# Patient Record
Sex: Male | Born: 1985 | Race: White | Hispanic: No | Marital: Single | Smoking: Current every day smoker
Health system: Southern US, Community
[De-identification: ages and names within clinical notes are randomized; demographics above are authoritative.]

## PROBLEM LIST (undated history)

## (undated) DIAGNOSIS — N451 Epididymitis: Secondary | ICD-10-CM

---
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
## 2006-08-01 ENCOUNTER — Emergency Department: Payer: Self-pay | Admitting: Unknown Physician Specialty

## 2007-01-17 ENCOUNTER — Emergency Department: Payer: Self-pay | Admitting: Emergency Medicine

## 2008-08-27 ENCOUNTER — Emergency Department: Payer: Self-pay | Admitting: Emergency Medicine

## 2008-09-30 ENCOUNTER — Ambulatory Visit: Payer: Self-pay | Admitting: Specialist

## 2008-12-27 ENCOUNTER — Emergency Department: Payer: Self-pay | Admitting: Emergency Medicine

## 2009-01-20 ENCOUNTER — Emergency Department: Payer: Self-pay | Admitting: Emergency Medicine

## 2009-03-01 ENCOUNTER — Emergency Department: Payer: Self-pay | Admitting: Emergency Medicine

## 2009-05-06 ENCOUNTER — Emergency Department: Payer: Self-pay | Admitting: Emergency Medicine

## 2009-10-31 ENCOUNTER — Emergency Department: Payer: Self-pay | Admitting: Emergency Medicine

## 2010-01-04 ENCOUNTER — Emergency Department: Payer: Self-pay | Admitting: Unknown Physician Specialty

## 2010-09-08 ENCOUNTER — Emergency Department (HOSPITAL_COMMUNITY): Admission: EM | Admit: 2010-09-08 | Discharge: 2010-01-19 | Payer: Self-pay | Admitting: Emergency Medicine

## 2010-11-17 ENCOUNTER — Emergency Department: Payer: Self-pay | Admitting: Emergency Medicine

## 2010-12-20 LAB — URINALYSIS, ROUTINE W REFLEX MICROSCOPIC
Glucose, UA: NEGATIVE mg/dL
Hgb urine dipstick: NEGATIVE
Nitrite: NEGATIVE
Specific Gravity, Urine: 1.029 (ref 1.005–1.030)
pH: 6 (ref 5.0–8.0)

## 2011-01-25 ENCOUNTER — Emergency Department: Payer: Self-pay | Admitting: Emergency Medicine

## 2011-04-21 ENCOUNTER — Emergency Department: Payer: Self-pay | Admitting: Emergency Medicine

## 2011-07-19 ENCOUNTER — Emergency Department (HOSPITAL_COMMUNITY)
Admission: EM | Admit: 2011-07-19 | Discharge: 2011-07-19 | Disposition: A | Discharge status: Home / Self Care | Payer: Self-pay | Attending: Emergency Medicine | Admitting: Emergency Medicine

## 2011-07-19 ENCOUNTER — Encounter (HOSPITAL_COMMUNITY): Payer: Self-pay | Admitting: Radiology

## 2011-07-19 ENCOUNTER — Emergency Department (HOSPITAL_COMMUNITY): Payer: Self-pay

## 2011-07-19 DIAGNOSIS — N509 Disorder of male genital organs, unspecified: Secondary | ICD-10-CM | POA: Insufficient documentation

## 2011-07-19 DIAGNOSIS — N453 Epididymo-orchitis: Secondary | ICD-10-CM | POA: Insufficient documentation

## 2011-07-19 LAB — BASIC METABOLIC PANEL
BUN: 12 mg/dL (ref 6–23)
Chloride: 98 mEq/L (ref 96–112)
Glucose, Bld: 92 mg/dL (ref 70–99)

## 2011-07-19 LAB — URINALYSIS, ROUTINE W REFLEX MICROSCOPIC
Bilirubin Urine: NEGATIVE
Glucose, UA: NEGATIVE mg/dL
Ketones, ur: NEGATIVE mg/dL
Protein, ur: NEGATIVE mg/dL
Urobilinogen, UA: 0.2 mg/dL (ref 0.0–1.0)

## 2011-07-19 LAB — CBC
Hemoglobin: 13.7 g/dL (ref 13.0–17.0)
MCH: 29.4 pg (ref 26.0–34.0)
MCHC: 34.1 g/dL (ref 30.0–36.0)
Platelets: 261 10*3/uL (ref 150–400)
RDW: 13.5 % (ref 11.5–15.5)

## 2011-07-19 LAB — DIFFERENTIAL
Eosinophils Absolute: 0.2 10*3/uL (ref 0.0–0.7)
Lymphs Abs: 3.2 10*3/uL (ref 0.7–4.0)
Monocytes Relative: 10 % (ref 3–12)

## 2011-07-19 LAB — URINE MICROSCOPIC-ADD ON

## 2011-07-21 ENCOUNTER — Emergency Department: Payer: Self-pay | Admitting: Emergency Medicine

## 2011-07-23 ENCOUNTER — Emergency Department: Payer: Self-pay | Admitting: *Deleted

## 2012-01-07 ENCOUNTER — Emergency Department: Payer: Self-pay | Admitting: Emergency Medicine

## 2012-01-07 LAB — URINALYSIS, COMPLETE
Bilirubin,UR: NEGATIVE
Hyaline Cast: 1
Ketone: NEGATIVE
Leukocyte Esterase: NEGATIVE
Ph: 5 (ref 4.5–8.0)
Specific Gravity: 1.024 (ref 1.003–1.030)

## 2012-01-08 LAB — URINE CULTURE

## 2012-05-09 ENCOUNTER — Emergency Department: Payer: Self-pay | Admitting: *Deleted

## 2012-05-09 LAB — URINALYSIS, COMPLETE
Glucose,UR: NEGATIVE mg/dL (ref 0–75)
Leukocyte Esterase: NEGATIVE
Nitrite: NEGATIVE
Protein: NEGATIVE
RBC,UR: 1 /HPF (ref 0–5)
Specific Gravity: 1.016 (ref 1.003–1.030)
WBC UR: 2 /HPF (ref 0–5)

## 2012-05-09 LAB — DRUG SCREEN, URINE
Barbiturates, Ur Screen: NEGATIVE (ref ?–200)
Cannabinoid 50 Ng, Ur ~~LOC~~: POSITIVE (ref ?–50)
Cocaine Metabolite,Ur ~~LOC~~: NEGATIVE (ref ?–300)
Methadone, Ur Screen: NEGATIVE (ref ?–300)
Opiate, Ur Screen: POSITIVE (ref ?–300)
Phencyclidine (PCP) Ur S: NEGATIVE (ref ?–25)

## 2013-04-22 ENCOUNTER — Emergency Department: Payer: Self-pay | Admitting: Emergency Medicine

## 2013-05-04 ENCOUNTER — Emergency Department: Payer: Self-pay | Admitting: Emergency Medicine

## 2013-05-04 LAB — CBC WITH DIFFERENTIAL/PLATELET
Basophil #: 0.1 10*3/uL (ref 0.0–0.1)
Eosinophil #: 0.1 10*3/uL (ref 0.0–0.7)
Lymphocyte %: 33.1 %
MCHC: 34.4 g/dL (ref 32.0–36.0)
Monocyte #: 0.8 x10 3/mm (ref 0.2–1.0)
Neutrophil %: 55.2 %
RDW: 14.6 % — ABNORMAL HIGH (ref 11.5–14.5)
WBC: 9.1 10*3/uL (ref 3.8–10.6)

## 2013-05-04 LAB — BASIC METABOLIC PANEL
BUN: 7 mg/dL (ref 7–18)
Creatinine: 0.85 mg/dL (ref 0.60–1.30)
EGFR (African American): >60
Glucose: 95 mg/dL (ref 65–99)
Osmolality: 277 (ref 275–301)
Potassium: 3.7 mmol/L (ref 3.5–5.1)

## 2013-05-09 LAB — CULTURE, BLOOD (SINGLE)

## 2013-05-13 ENCOUNTER — Emergency Department: Payer: Self-pay | Admitting: Emergency Medicine

## 2013-05-13 LAB — COMPREHENSIVE METABOLIC PANEL
Albumin: 3.2 g/dL — ABNORMAL LOW (ref 3.4–5.0)
Alkaline Phosphatase: 91 U/L (ref 50–136)
Anion Gap: 5 — ABNORMAL LOW (ref 7–16)
BUN: 7 mg/dL (ref 7–18)
Bilirubin,Total: 0.4 mg/dL (ref 0.2–1.0)
Calcium, Total: 8.5 mg/dL (ref 8.5–10.1)
Chloride: 105 mmol/L (ref 98–107)
Co2: 29 mmol/L (ref 21–32)
Creatinine: 0.91 mg/dL (ref 0.60–1.30)
EGFR (African American): >60
EGFR (Non-African Amer.): >60
Glucose: 91 mg/dL (ref 65–99)
Osmolality: 275 (ref 275–301)
Potassium: 4.2 mmol/L (ref 3.5–5.1)
SGOT(AST): 212 U/L — ABNORMAL HIGH (ref 15–37)
SGPT (ALT): 484 U/L — ABNORMAL HIGH (ref 12–78)
Sodium: 139 mmol/L (ref 136–145)
Total Protein: 7.3 g/dL (ref 6.4–8.2)

## 2013-05-13 LAB — CBC
HCT: 43.5 % (ref 40.0–52.0)
HGB: 14.7 g/dL (ref 13.0–18.0)
MCH: 28.7 pg (ref 26.0–34.0)
MCHC: 33.9 g/dL (ref 32.0–36.0)
MCV: 85 fL (ref 80–100)
Platelet: 246 10*3/uL (ref 150–440)
RBC: 5.13 10*6/uL (ref 4.40–5.90)
RDW: 14.6 % — ABNORMAL HIGH (ref 11.5–14.5)
WBC: 7.1 10*3/uL (ref 3.8–10.6)

## 2013-05-13 LAB — LIPASE, BLOOD: Lipase: 86 U/L (ref 73–393)

## 2013-05-29 ENCOUNTER — Emergency Department: Payer: Self-pay | Admitting: Emergency Medicine

## 2013-05-29 LAB — URINALYSIS, COMPLETE
Bacteria: NONE SEEN
Blood: NEGATIVE
Nitrite: NEGATIVE
Ph: 5 (ref 4.5–8.0)
RBC,UR: <1 /HPF (ref 0–5)
Specific Gravity: 1.031 (ref 1.003–1.030)
WBC UR: 1 /HPF (ref 0–5)

## 2013-06-24 ENCOUNTER — Encounter (HOSPITAL_COMMUNITY): Payer: Self-pay | Admitting: *Deleted

## 2013-06-24 ENCOUNTER — Emergency Department (HOSPITAL_COMMUNITY): Payer: Self-pay

## 2013-06-24 ENCOUNTER — Emergency Department (HOSPITAL_COMMUNITY)
Admission: EM | Admit: 2013-06-24 | Discharge: 2013-06-24 | Disposition: A | Discharge status: Home / Self Care | Payer: Self-pay | Attending: Emergency Medicine | Admitting: Emergency Medicine

## 2013-06-24 DIAGNOSIS — R109 Unspecified abdominal pain: Secondary | ICD-10-CM | POA: Insufficient documentation

## 2013-06-24 DIAGNOSIS — N50819 Testicular pain, unspecified: Secondary | ICD-10-CM

## 2013-06-24 DIAGNOSIS — F17200 Nicotine dependence, unspecified, uncomplicated: Secondary | ICD-10-CM | POA: Insufficient documentation

## 2013-06-24 DIAGNOSIS — Z87442 Personal history of urinary calculi: Secondary | ICD-10-CM | POA: Insufficient documentation

## 2013-06-24 DIAGNOSIS — N509 Disorder of male genital organs, unspecified: Secondary | ICD-10-CM | POA: Insufficient documentation

## 2013-06-24 HISTORY — DX: Epididymitis: N45.1

## 2013-06-24 LAB — CBC
HCT: 42.9 % (ref 39.0–52.0)
MCHC: 35.4 g/dL (ref 30.0–36.0)
MCV: 85 fL (ref 78.0–100.0)
Platelets: 262 10*3/uL (ref 150–400)
RBC: 5.05 MIL/uL (ref 4.22–5.81)
RDW: 14.6 % (ref 11.5–15.5)
WBC: 9.6 10*3/uL (ref 4.0–10.5)

## 2013-06-24 LAB — URINALYSIS, ROUTINE W REFLEX MICROSCOPIC
Glucose, UA: NEGATIVE mg/dL
Ketones, ur: NEGATIVE mg/dL
Leukocytes, UA: NEGATIVE

## 2013-06-24 LAB — POCT I-STAT, CHEM 8
Chloride: 99 mEq/L (ref 96–112)
Creatinine, Ser: 1.2 mg/dL (ref 0.50–1.35)
Hemoglobin: 16 g/dL (ref 13.0–17.0)
Potassium: 4 mEq/L (ref 3.5–5.1)
Sodium: 141 mEq/L (ref 135–145)

## 2013-06-24 MED ORDER — TRAMADOL HCL 50 MG PO TABS: 50.0000 mg | ORAL_TABLET | Freq: Four times a day (QID) | ORAL | Status: AC | PRN

## 2013-06-24 MED ORDER — OXYCODONE-ACETAMINOPHEN 5-325 MG PO TABS
2.0000 | ORAL_TABLET | Freq: Once | ORAL | Status: AC
  Administered 2013-06-24: 2 via ORAL
  Filled 2013-06-24: qty 2

## 2013-06-24 NOTE — ED Provider Notes (Signed)
Medical screening examination/treatment/procedure(s) were performed by non-physician practitioner and as supervising physician I was immediately available for consultation/collaboration.  Jayten Gabbard, MD 06/24/13 0608 

## 2013-06-24 NOTE — ED Provider Notes (Signed)
CSN: [id number]     Arrival date & time 06/24/13  0003 History   First MD Initiated Contact with Patient 06/24/13 [phone]     Chief Complaint  Patient presents with  . Testicle Pain   (Consider location/radiation/quality/duration/timing/severity/associated sxs/prior Treatment) HPI Comments: PAtient states that for the past 5 days he has had intermittent worsening L testicular pain and swelling>L flank pain and abdominal pain  Hx of kidney stones as well  Patient is a 27 y.o. male presenting with testicular pain. The history is provided by the patient.  Testicle Pain This is a recurrent problem. The current episode started in the past 7 days. The problem occurs constantly. The problem has been gradually worsening. Associated symptoms include abdominal pain. Pertinent negatives include no fever or urinary symptoms. He has tried nothing for the symptoms. The treatment provided no relief.    Past Medical History  Diagnosis Date  . Epididymitis    History reviewed. No pertinent past surgical history. History reviewed. No pertinent family history. History  Substance Use Topics  . Smoking status: Current Every Day Smoker  . Smokeless tobacco: Never Used  . Alcohol Use: Yes    Review of Systems  Constitutional: Negative for fever and appetite change.  Gastrointestinal: Positive for abdominal pain.  Genitourinary: Positive for dysuria, flank pain and testicular pain. Negative for penile swelling.  All other systems reviewed and are negative.    Allergies  Hydrocodone and Motrin  Home Medications   Current Outpatient Rx  Name  Route  Sig  Dispense  Refill  . ibuprofen (ADVIL,MOTRIN) 200 MG tablet   Oral   Take 400 mg by mouth every 6 (six) hours as needed for pain.         . phenylephrine (SUDAFED PE) 10 MG TABS tablet   Oral   Take 10 mg by mouth every 4 (four) hours as needed (sinus pressure).          BP 114/71  Pulse 94  Temp(Src) 97.5 F (36.4 C) (Oral)  Resp 17   Ht 5\' 9"  (1.753 m)  Wt 235 lb (106.595 kg)  BMI 34.69 kg/m2  SpO2 91% Physical Exam  Vitals reviewed. Constitutional: He is oriented to person, place, and time. He appears well-developed and well-nourished.  HENT:  Head: Normocephalic.  Eyes: Pupils are equal, round, and reactive to light.  Neck: Normal range of motion.  Pulmonary/Chest: Effort normal.  Abdominal: Soft. Bowel sounds are normal. He exhibits no distension.  Musculoskeletal: Normal range of motion.  Neurological: He is alert and oriented to person, place, and time.  Skin: Skin is warm.    ED Course  Procedures (including critical care time) Labs Review Labs Reviewed  URINALYSIS, ROUTINE W REFLEX MICROSCOPIC - Abnormal; Notable for the following:    APPearance HAZY (*)    All other components within normal limits  CBC  POCT I-STAT, CHEM 8   Imaging Review US Scrotum  06/24/2013   CLINICAL DATA:  Testicular pain.  EXAM: SCROTAL ULTRASOUND  DOPPLER ULTRASOUND OF THE TESTICLES  TECHNIQUE: Complete ultrasound examination of the testicles, epididymis, and other scrotal structures was performed. Color and spectral Doppler ultrasound were also utilized to evaluate blood flow to the testicles.  COMPARISON:  Scrotal ultrasound 01/19/2010 and CT abdomen and pelvis 07/19/2011.  FINDINGS: Right testis: Measures 4.0 x 2.2 x 2.5 cm and demonstrates normal, homogeneous echotexture.  Left testis: Measures 3.9 x 2.1 x 2.3 cm and demonstrates normal, homogeneous echotexture.  Right epididymis:  Normal in  size and appearance.  Left epididymis:  Normal in size and appearance.  Hydrocele:  None.  Varicocele:  Again seen is a varicocele on the left.  Pulsed Doppler interrogation of both testes demonstrates low resistance arterial and venous waveforms bilaterally.  IMPRESSION: No acute finding.  Left varicocele, unchanged.   Electronically Signed   By: Drusilla Kanner M.D.   On: 06/24/2013 04:02   [place] Art/ven Flow Abd Pelv Doppler  06/24/2013    CLINICAL DATA:  Testicular pain.  EXAM: SCROTAL ULTRASOUND  DOPPLER ULTRASOUND OF THE TESTICLES  TECHNIQUE: Complete ultrasound examination of the testicles, epididymis, and other scrotal structures was performed. Color and spectral Doppler ultrasound were also utilized to evaluate blood flow to the testicles.  COMPARISON:  Scrotal ultrasound 01/19/2010 and CT abdomen and pelvis 07/19/2011.  FINDINGS: Right testis: Measures 4.0 x 2.2 x 2.5 cm and demonstrates normal, homogeneous echotexture.  Left testis: Measures 3.9 x 2.1 x 2.3 cm and demonstrates normal, homogeneous echotexture.  Right epididymis:  Normal in size and appearance.  Left epididymis:  Normal in size and appearance.  Hydrocele:  None.  Varicocele:  Again seen is a varicocele on the left.  Pulsed Doppler interrogation of both testes demonstrates low resistance arterial and venous waveforms bilaterally.  IMPRESSION: No acute finding.  Left varicocele, unchanged.   Electronically Signed   By: Drusilla Kanner M.D.   On: 06/24/2013 04:02    MDM  No diagnosis found. Ultrasound is negative for torsion but he does have a variocele which has been seen in the past  Due to other concerning symptoms will obtain CT abd/.pelvis to R/O stone urine is also pending      Arman Filter, NP 06/24/13 [phone]

## 2013-06-24 NOTE — ED Notes (Addendum)
Pt c/o left testicle pain and swelling for the past 5-6 days. Swelling comes and goes. Pt has hx of epididymitis. Reports similar symptoms. Pt reports left flank pain, dysuria, hematuria. Denies discharge. A&Ox4, respirations equal and unlabored, skin warm and dry.

## 2013-06-24 NOTE — ED Notes (Signed)
Patient currently in ultrasound.

## 2013-08-29 ENCOUNTER — Emergency Department: Payer: Self-pay | Admitting: Emergency Medicine

## 2013-08-30 ENCOUNTER — Emergency Department: Payer: Self-pay | Admitting: Emergency Medicine

## 2013-08-30 LAB — CBC
HCT: 40.7 % (ref 40.0–52.0)
HGB: 14.1 g/dL (ref 13.0–18.0)
MCHC: 34.8 g/dL (ref 32.0–36.0)
MCV: 88 fL (ref 80–100)
Platelet: 260 10*3/uL (ref 150–440)
RBC: 4.61 10*6/uL (ref 4.40–5.90)
RDW: 14 % (ref 11.5–14.5)
WBC: 8.6 10*3/uL (ref 3.8–10.6)

## 2013-08-31 LAB — COMPREHENSIVE METABOLIC PANEL
Albumin: 3.4 g/dL (ref 3.4–5.0)
BUN: 8 mg/dL (ref 7–18)
Co2: 23 mmol/L (ref 21–32)
Creatinine: 0.55 mg/dL — ABNORMAL LOW (ref 0.60–1.30)
Potassium: 4.4 mmol/L (ref 3.5–5.1)
SGPT (ALT): 553 U/L — ABNORMAL HIGH (ref 12–78)

## 2013-10-20 ENCOUNTER — Emergency Department: Payer: Self-pay | Admitting: Emergency Medicine

## 2013-10-28 ENCOUNTER — Emergency Department: Payer: Self-pay | Admitting: Emergency Medicine

## 2013-11-21 ENCOUNTER — Emergency Department: Payer: Self-pay | Admitting: Emergency Medicine

## 2014-03-05 ENCOUNTER — Emergency Department: Payer: Self-pay | Admitting: Emergency Medicine

## 2014-03-05 IMAGING — US US PELVIS LIMITED
1 series · 14 of 25 positions shown · non-contrast
Comparison: none

REASON FOR EXAM: testicular pain
COMMENTS:

PROCEDURE:     US  - US TESTICULAR  - May 29, 2013 [DATE]
RESULT:
TECHNIQUE: Grayscale, duplex color flow Doppler, and spectral waveform
imaging was performed, real time, for evaluation of the scrotum. Static
representative imaging was provided for interpretation.

[Series 1: us pelvis limited · 0.08mm/px · 14 of 54 slices shown]
[im 1/54]
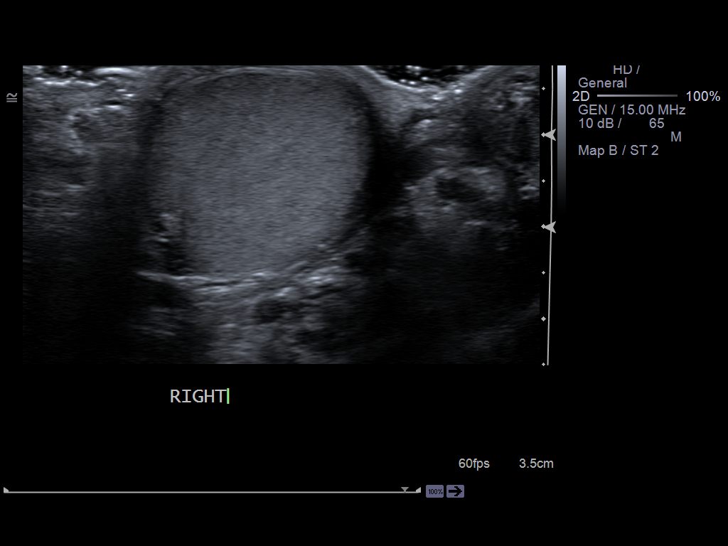
[im 5/54]
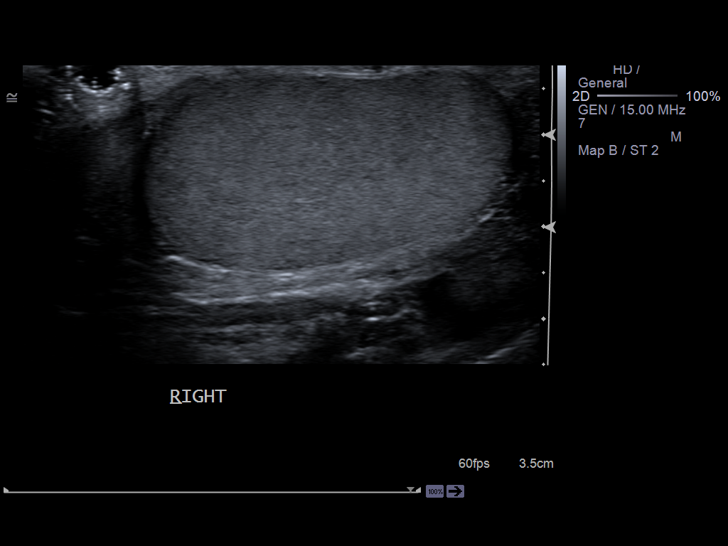
[im 9/54]
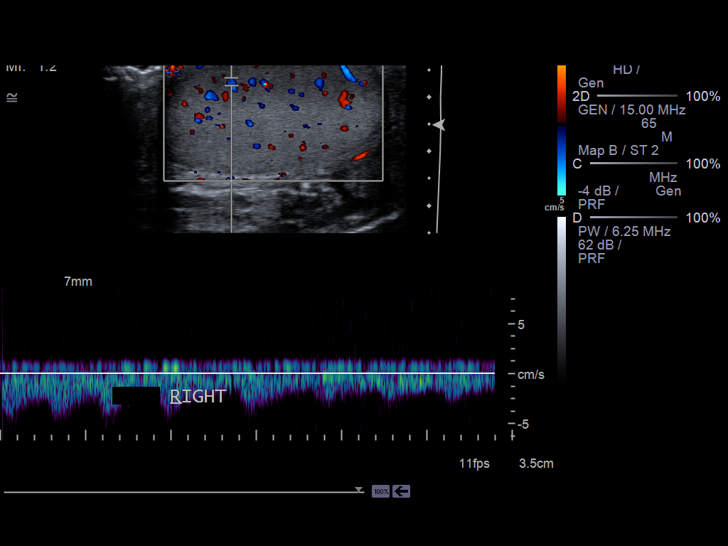
[im 14/54]
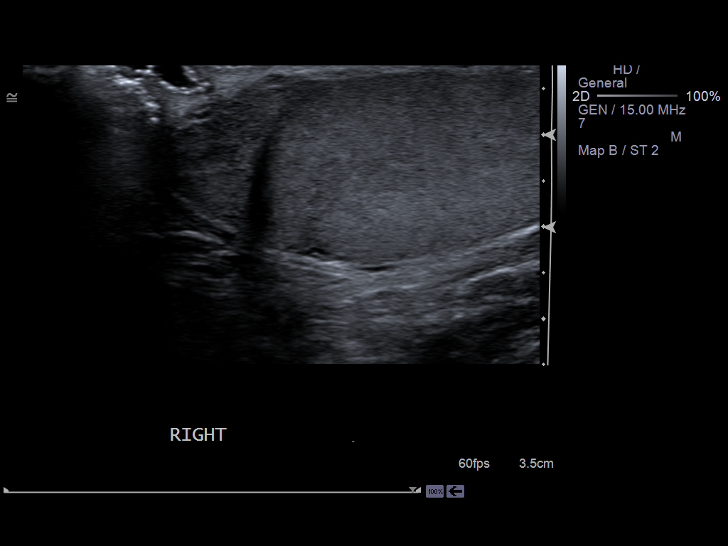
[im 18/54]
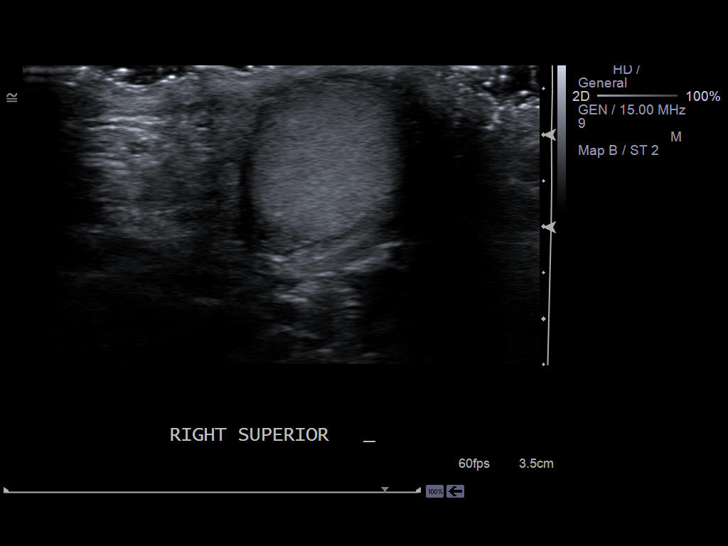
[im 20/54]
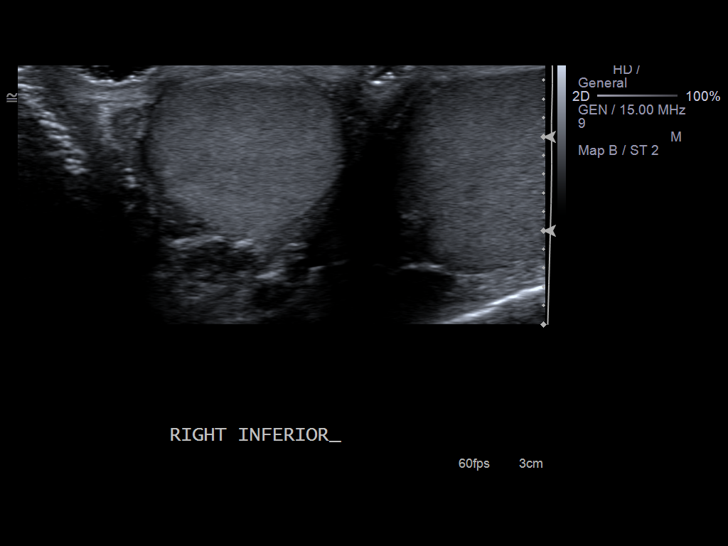
[im 25/54]
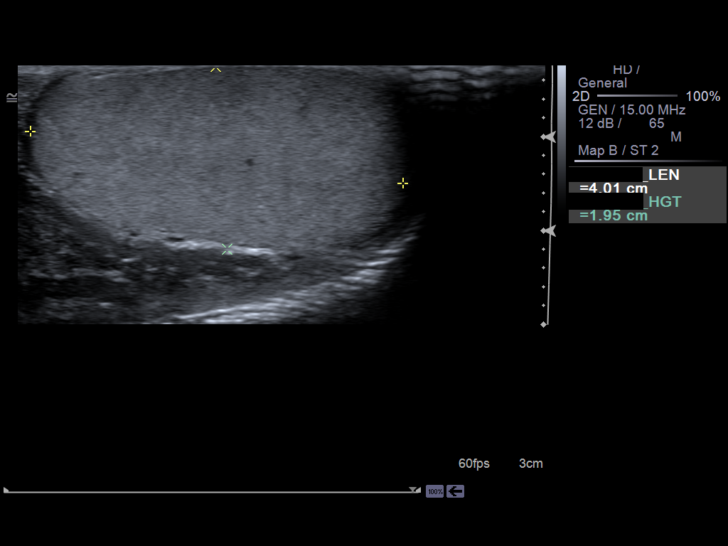
[im 29/54]
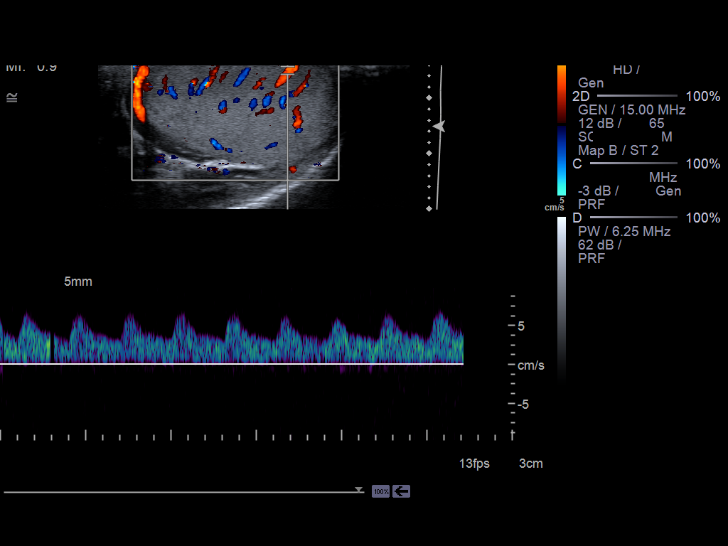
[im 34/54]
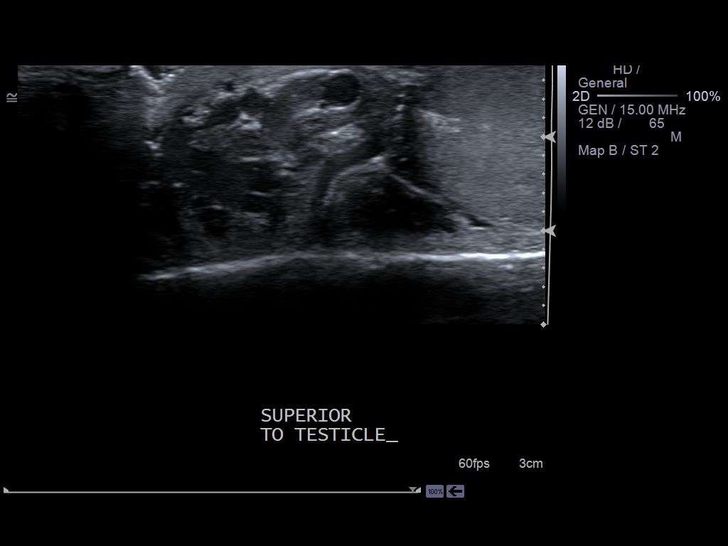
[im 36/54]
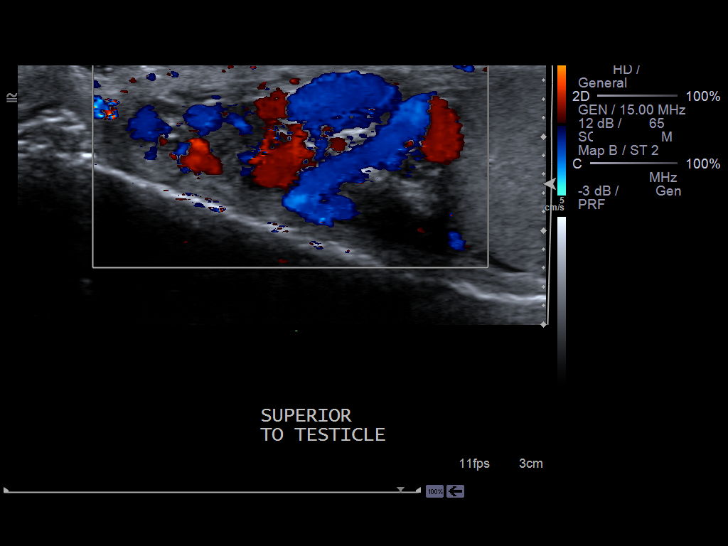
[im 40/54]
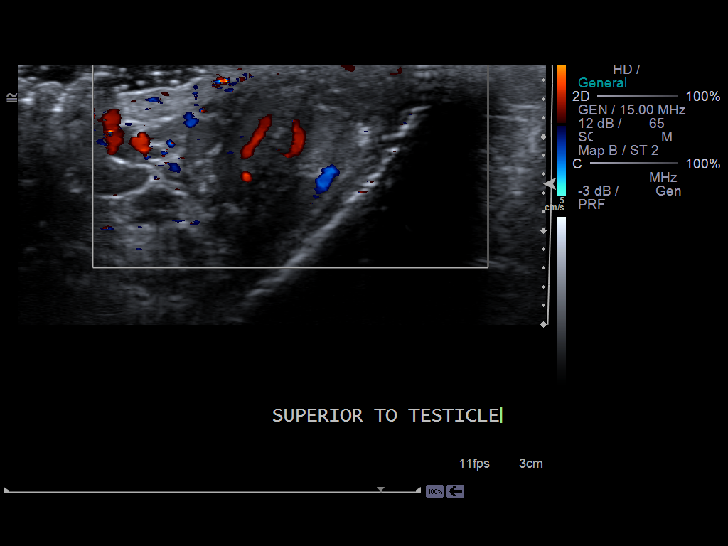
[im 45/54]
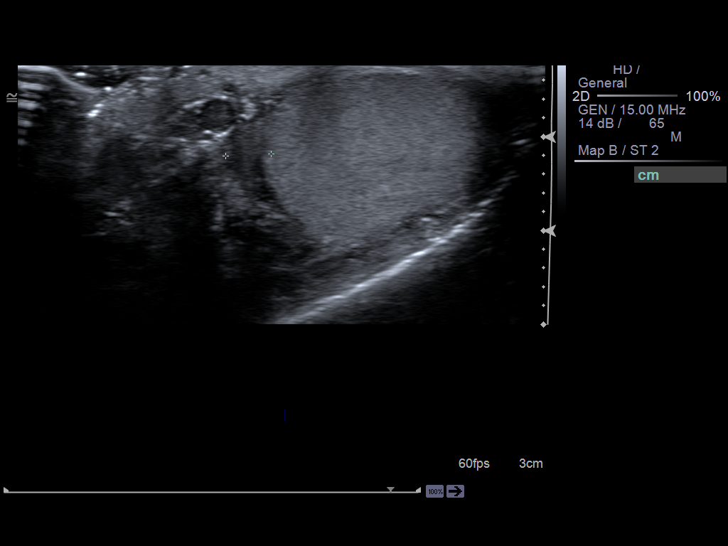
[im 49/54]
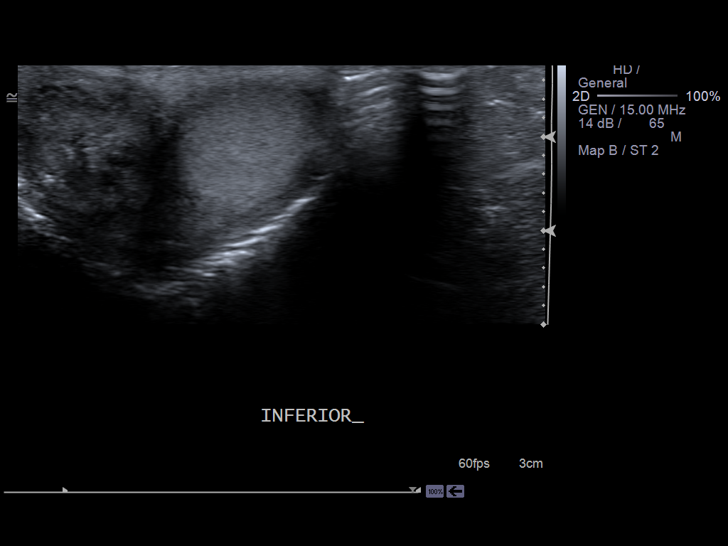
[im 54/54]
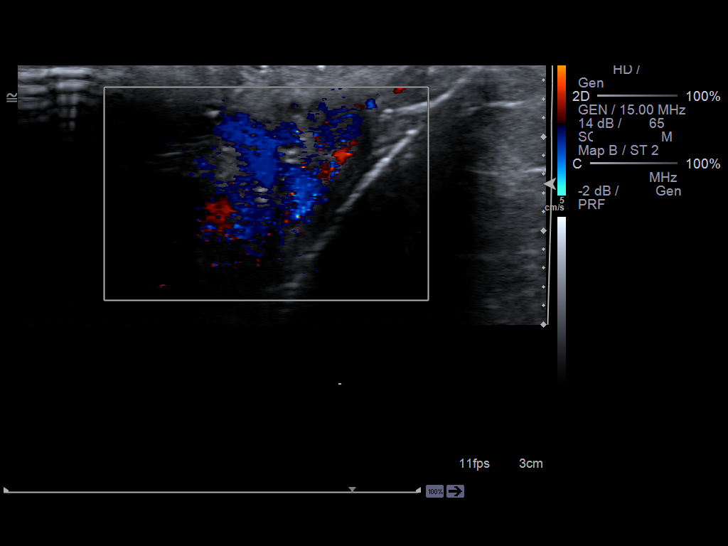

[14 of 25 positions shown; findings below may reference images not displayed]

FINDINGS: The right testicle measures 4 x 2.5 x 2.51 cm and the left 4.01 x
1.95 x 2.28 cm. The testicles demonstrate a homogeneous echotexture. Color
filling of the vessels is identified within the right and left testicles. A
varicocele is appreciated within the left hemiscrotum. There is no evidence
of hydroceles. The right epididymis measures 1.86 x 0.97 x 0.71 cm and the
left 1.76 x 0.37 x 0.49 cm. The epididymides demonstrate homogeneous
echotexture. Arterial and venous waveforms are identified within the
testicles.
IMPRESSION: Varicocele on the left. Otherwise unremarkable Testicular
Ultrasound.

## 2014-03-31 IMAGING — CT CT ABD-PELV W/O CM
2 of 4 series · 17 of 46 positions shown, 19 images · non-contrast
Comparison: CT abdomen and pelvis 07/19/2011 and scrotal ultrasound
earlier this same day.

CLINICAL DATA: Left flank pain, dysuria and hematuria.

EXAM:
CT ABDOMEN AND PELVIS WITHOUT CONTRAST
TECHNIQUE: Multidetector CT imaging of the abdomen and pelvis was performed
following the standard protocol without intravenous contrast.

[Series 2: stone study 5.0 i30f 1 · axial · 0.90mm/px · z∈[-484,-28]mm · 14 of 99 slices shown, 16 images]
[im 4/99  soft-tissue]
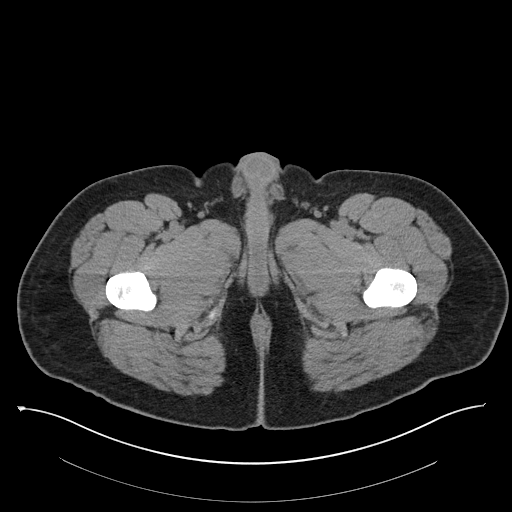
[im 4/99  bone]
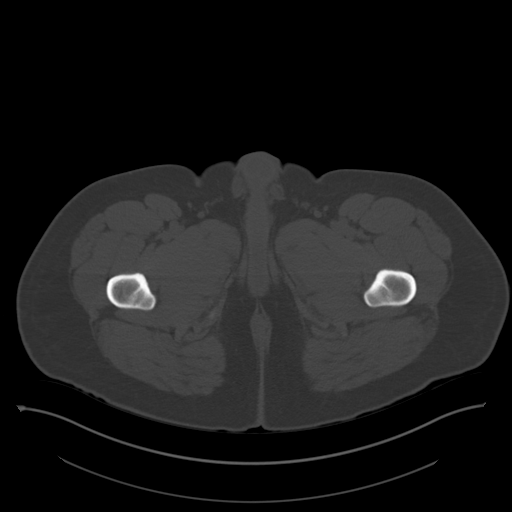
[im 12/99  soft-tissue]
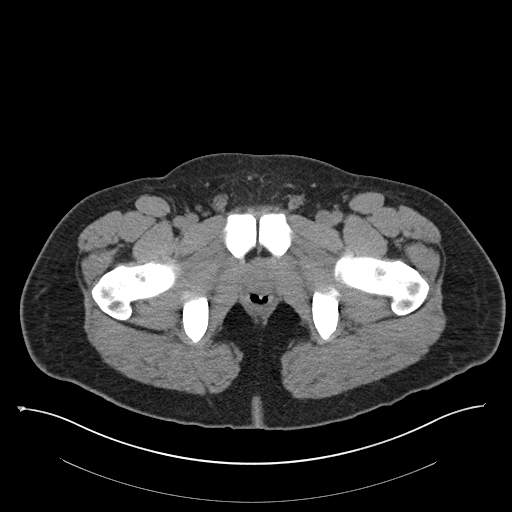
[im 20/99  soft-tissue]
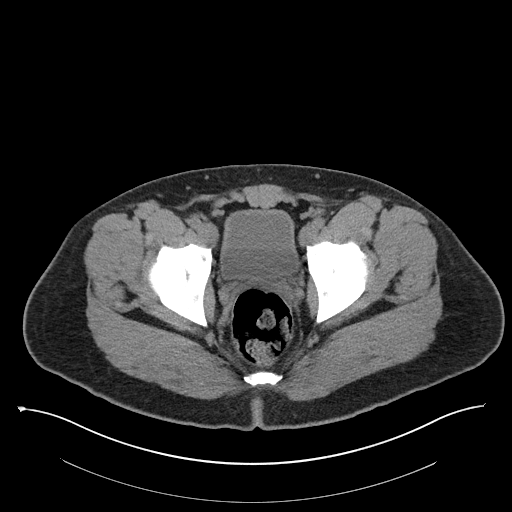
[im 28/99  soft-tissue]
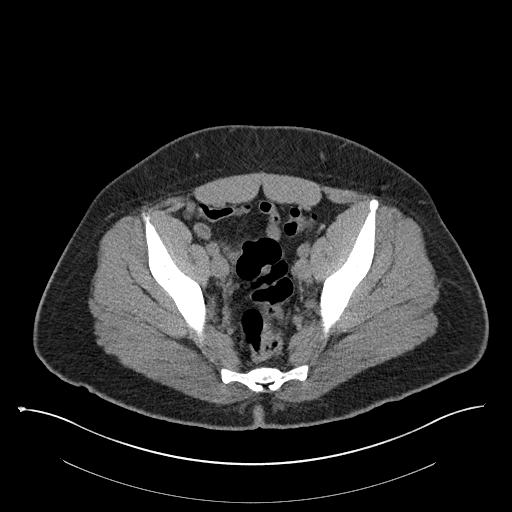
[im 32/99  soft-tissue]
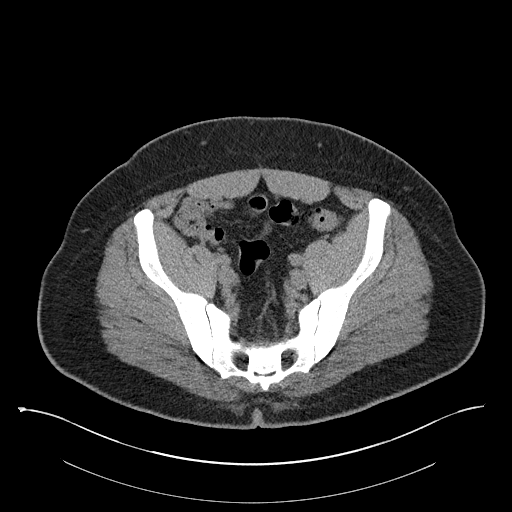
[im 40/99  soft-tissue]
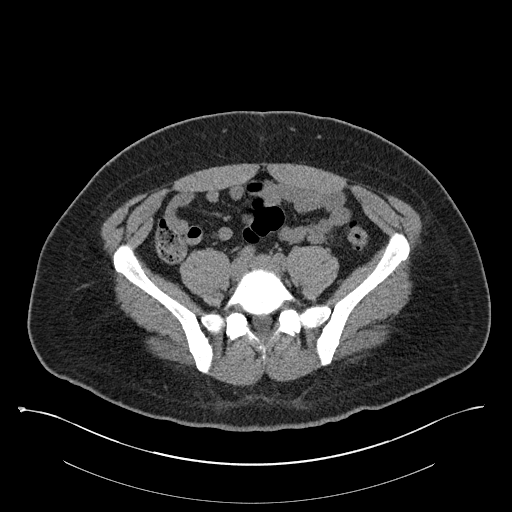
[im 48/99  soft-tissue]
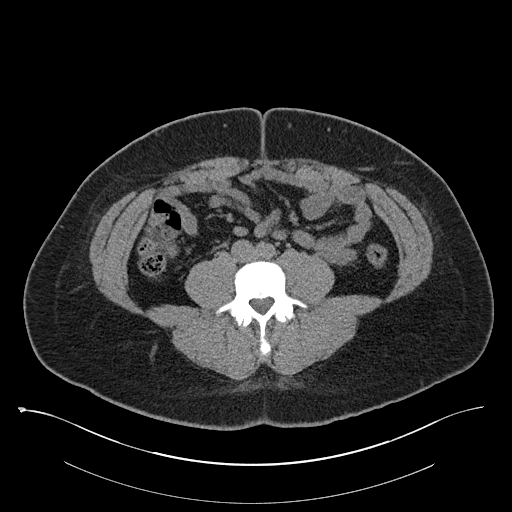
[im 51/99  soft-tissue]
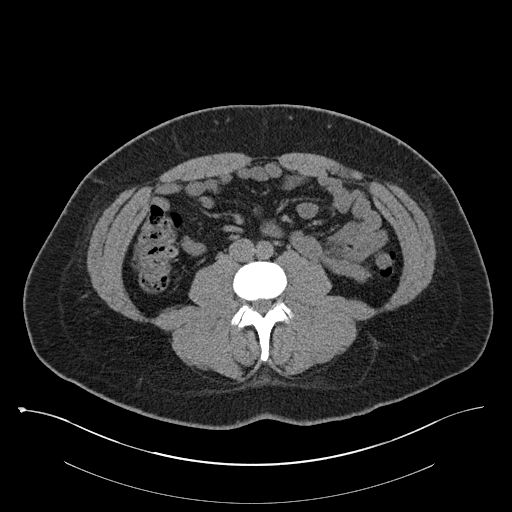
[im 59/99  soft-tissue]
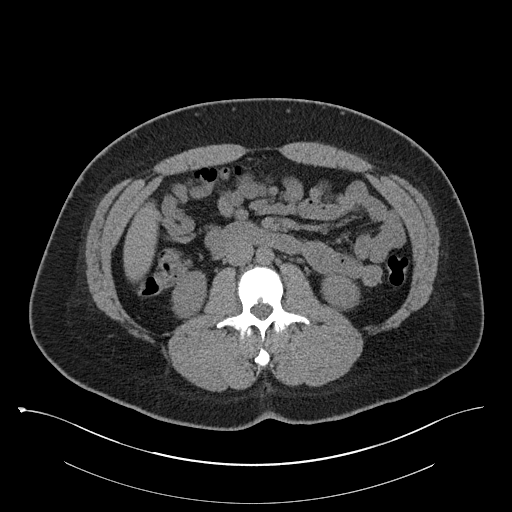
[im 59/99  bone]
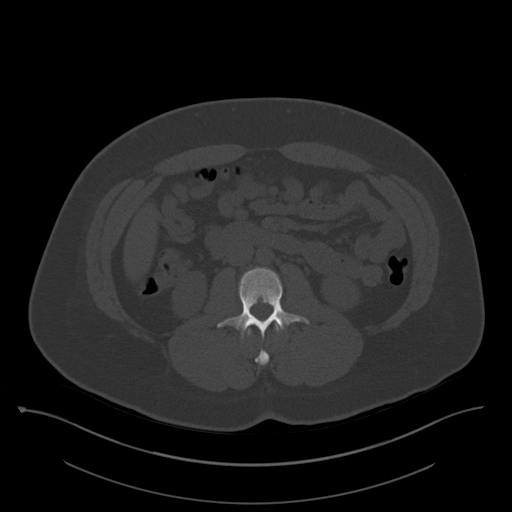
[im 67/99  soft-tissue]
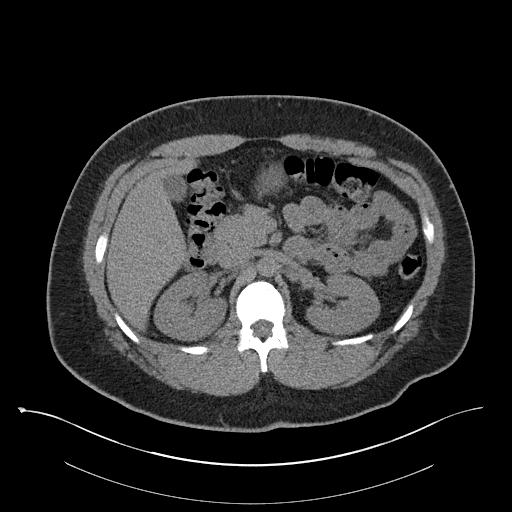
[im 75/99  soft-tissue]
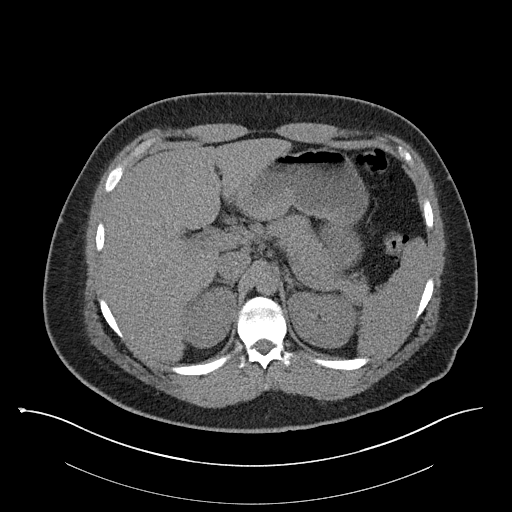
[im 79/99  soft-tissue]
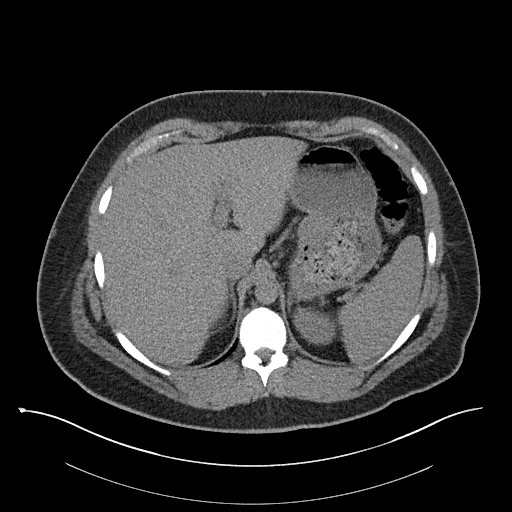
[im 87/99  soft-tissue]
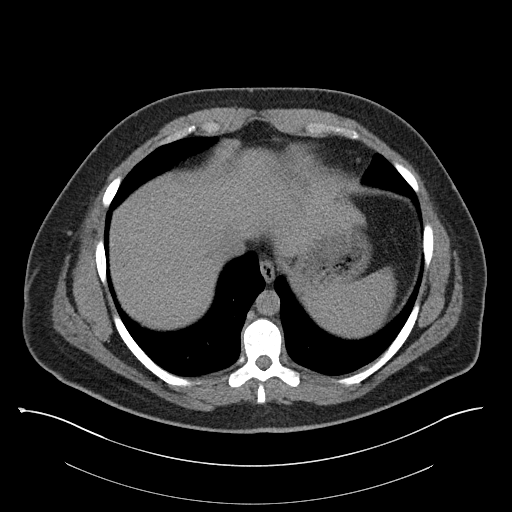
[im 95/99  soft-tissue]
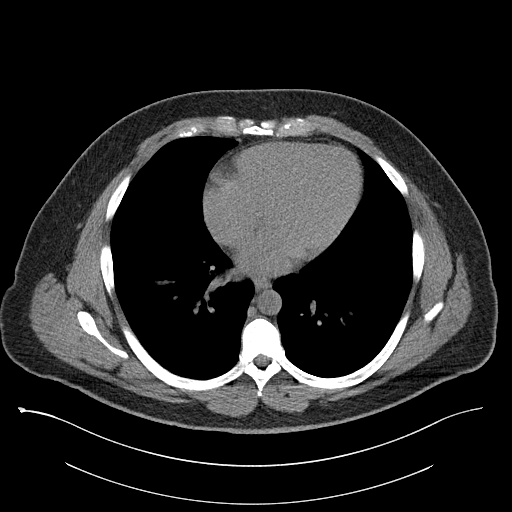

[Series 5: coronal soft tissue · coronal · 0.88mm/px · 3 of 101 slices shown]
[im 34/101  soft-tissue]
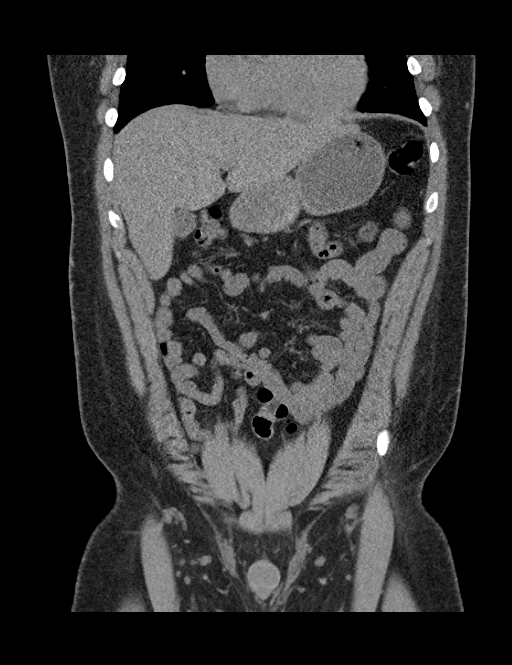
[im 45/101  soft-tissue]
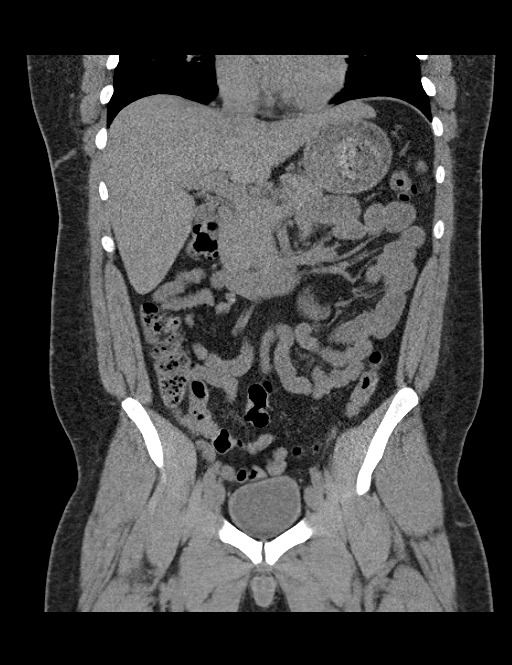
[im 56/101  soft-tissue]
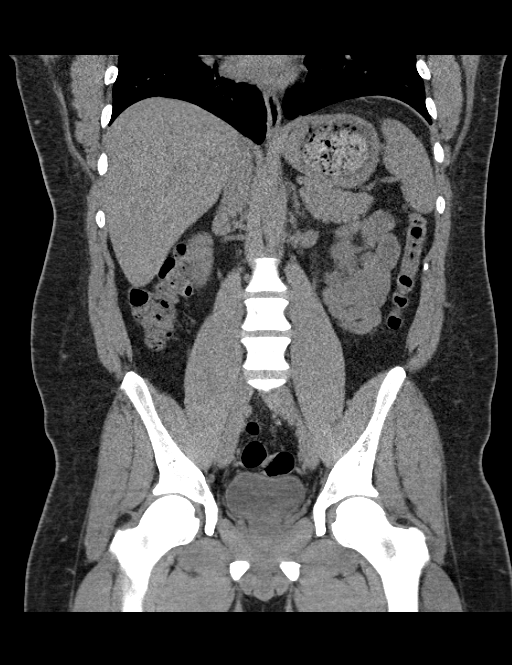

[17 of 46 positions shown; findings below may reference images not displayed]

FINDINGS: The lung bases are clear. No pleural or pericardial effusion is
identified.

No renal ureteral stones are identified. There is no hydronephrosis
on the right or left. The kidneys have a normal uninfused
appearance. The gallbladder, liver, spleen, adrenal glands and
pancreas all appear normal. Urinary bladder, seminal vesicles and
prostate gland are all unremarkable. The stomach, small and large
bowel and appendix appear normal. There is no lymphadenopathy or
fluid. No focal bony abnormality is identified.
IMPRESSION: Negative for urinary tract stone.  Negative exam.

## 2015-01-16 ENCOUNTER — Emergency Department
Admit: 2015-01-16 | Disposition: A | Discharge status: Home / Self Care | Payer: Self-pay | Admitting: Emergency Medicine

## 2015-01-16 LAB — CBC WITH DIFFERENTIAL/PLATELET
BASOS PCT: 0.4 %
Basophil #: 0 10*3/uL (ref 0.0–0.1)
EOS PCT: 4.1 %
Eosinophil #: 0.4 10*3/uL (ref 0.0–0.7)
HCT: 47.1 % (ref 40.0–52.0)
HGB: 16.2 g/dL (ref 13.0–18.0)
LYMPHS PCT: 41 %
Lymphocyte #: 3.7 10*3/uL — ABNORMAL HIGH (ref 1.0–3.6)
MCH: 30.5 pg (ref 26.0–34.0)
MCHC: 34.3 g/dL (ref 32.0–36.0)
MCV: 89 fL (ref 80–100)
MONOS PCT: 9.3 %
Monocyte #: 0.8 x10 3/mm (ref 0.2–1.0)
Neutrophil #: 4.1 10*3/uL (ref 1.4–6.5)
Neutrophil %: 45.2 %
Platelet: 270 10*3/uL (ref 150–440)
RBC: 5.3 10*6/uL (ref 4.40–5.90)
RDW: 13.9 % (ref 11.5–14.5)
WBC: 9.1 10*3/uL (ref 3.8–10.6)

## 2015-01-16 LAB — URINALYSIS, COMPLETE
BLOOD: NEGATIVE
Bacteria: NONE SEEN
Bilirubin,UR: NEGATIVE
Glucose,UR: NEGATIVE mg/dL (ref 0–75)
Ketone: NEGATIVE
LEUKOCYTE ESTERASE: NEGATIVE
NITRITE: NEGATIVE
Ph: 6 (ref 4.5–8.0)
Protein: NEGATIVE
Specific Gravity: 1.006 (ref 1.003–1.030)
Squamous Epithelial: NONE SEEN
WBC UR: NONE SEEN /HPF (ref 0–5)

## 2015-01-16 LAB — BASIC METABOLIC PANEL
Anion Gap: 8 (ref 7–16)
BUN: 8 mg/dL
CREATININE: 0.53 mg/dL — AB
Calcium, Total: 7.2 mg/dL — ABNORMAL LOW
Chloride: 109 mmol/L
Co2: 24 mmol/L
EGFR (African American): >60
EGFR (Non-African Amer.): >60
Glucose: 112 mg/dL — ABNORMAL HIGH
POTASSIUM: 2.9 mmol/L — AB
Sodium: 141 mmol/L

## 2022-02-16 ENCOUNTER — Emergency Department
Admission: EM | Admit: 2022-02-16 | Discharge: 2022-02-16 | Disposition: A | Discharge status: Home / Self Care | Payer: Self-pay | Attending: Student in an Organized Health Care Education/Training Program | Admitting: Student in an Organized Health Care Education/Training Program

## 2022-02-16 ENCOUNTER — Encounter: Payer: Self-pay | Admitting: *Deleted

## 2022-02-16 ENCOUNTER — Other Ambulatory Visit: Payer: Self-pay

## 2022-02-16 DIAGNOSIS — R0602 Shortness of breath: Secondary | ICD-10-CM | POA: Insufficient documentation

## 2022-02-16 DIAGNOSIS — Y92007 Garden or yard of unspecified non-institutional (private) residence as the place of occurrence of the external cause: Secondary | ICD-10-CM | POA: Insufficient documentation

## 2022-02-16 DIAGNOSIS — W5919XA Other contact with nonvenomous snake, initial encounter: Secondary | ICD-10-CM | POA: Insufficient documentation

## 2022-02-16 DIAGNOSIS — S81852A Open bite, left lower leg, initial encounter: Secondary | ICD-10-CM | POA: Insufficient documentation

## 2022-02-16 LAB — CBC WITH DIFFERENTIAL/PLATELET
Abs Immature Granulocytes: 0.03 10*3/uL (ref 0.00–0.07)
Basophils Absolute: 0.1 10*3/uL (ref 0.0–0.1)
Basophils Relative: 1 %
Eosinophils Absolute: 0.1 10*3/uL (ref 0.0–0.5)
Eosinophils Relative: 2 %
HCT: 40.5 % (ref 39.0–52.0)
Hemoglobin: 12.8 g/dL — ABNORMAL LOW (ref 13.0–17.0)
Immature Granulocytes: 0 %
Lymphocytes Relative: 32 %
Lymphs Abs: 2.4 10*3/uL (ref 0.7–4.0)
MCH: 27.5 pg (ref 26.0–34.0)
MCHC: 31.6 g/dL (ref 30.0–36.0)
MCV: 86.9 fL (ref 80.0–100.0)
Monocytes Absolute: 0.7 10*3/uL (ref 0.1–1.0)
Monocytes Relative: 9 %
Neutro Abs: 4.1 10*3/uL (ref 1.7–7.7)
Neutrophils Relative %: 56 %
Platelets: 294 10*3/uL (ref 150–400)
RBC: 4.66 MIL/uL (ref 4.22–5.81)
RDW: 13.1 % (ref 11.5–15.5)
WBC: 7.3 10*3/uL (ref 4.0–10.5)
nRBC: 0 % (ref 0.0–0.2)

## 2022-02-16 NOTE — ED Notes (Signed)
RN notified by provider that pt is missing and not in his room. RN search bathrooms, lobby and parking lot. RN was able to find pt IV in the trash.

## 2022-02-16 NOTE — ED Provider Notes (Addendum)
[facility] Emergency Department Provider Note     Event Date/Time   First MD Initiated Contact with Patient 02/16/22 1609     (approximate)   History   Snake Bite   HPI  Jared Velasquez is a 36 y.o. male presents himself to the ED for evaluation of a possible snakebite to the left lower leg.  Patient reports approximately 2 hours after he felt an acute pain to his posterior left leg.  He reports pain occurred after he walked past a tarp that was rolled up in his yard.  Patient later went back to move the tarp, when he unrolled it, he noted a tan-colored snake approximately 10 inches rolled out of the tarp.  His wife killed the snake, and has a photograph on her phone for evaluation.  Patient reports that after approximately 30 minutes after he saw the snake, he began to experience some shortness of breath.  He denies any nausea, vomiting, headache, dizziness, or syncope.  Patient presents with some pain to the posterior right calf but denies any bleeding, swelling, or bruising.    Physical Exam   Triage Vital Signs: ED Triage Vitals  Enc Vitals Group     BP 02/16/22 1559 138/89     Pulse Rate 02/16/22 1559 (!) 117     Resp 02/16/22 1559 20     Temp 02/16/22 1559 98.6 F (37 C)     Temp Source 02/16/22 1559 Oral     SpO2 02/16/22 1559 100 %     Weight 02/16/22 1554 250 lb (113.4 kg)     Height 02/16/22 1554 5\' 10"  (1.778 m)     Head Circumference --      Peak Flow --      Pain Score --      Pain Loc --      Pain Edu? --      Excl. in GC? --     Most recent vital signs: Vitals:   02/16/22 1700 02/16/22 1730  BP: 135/84 (!) 128/53  Pulse: (!) 110 (!) 107  Resp: (!) 23 15  Temp:    SpO2: 100% 100%    General Awake, no distress.  CV:  Good peripheral perfusion. Tachy rate RESP:  Normal effort. CTA ABD:  No distention. Soft, nontender MSK:  LLE without evidence of puncture wounds, fang marks, ecchymosis, or open wounds to the area of  concern.  No bleeding, bruising, erythema, or edema is appreciated.  Patient does have multiple superficial scabbed lesions consistent with a recent poison oak dermatitis.  No lymphangitis, induration, or warmth appreciated to the posterior left leg.    ED Results / Procedures / Treatments   Labs (all labs ordered are listed, but only abnormal results are displayed) Labs Reviewed  CBC WITH DIFFERENTIAL/PLATELET - Abnormal; Notable for the following components:      Result Value   Hemoglobin 12.8 (*)    All other components within normal limits  BASIC METABOLIC PANEL  TROPONIN I (HIGH SENSITIVITY)     EKG  Vent. rate 112 BPM PR interval 154 ms QRS duration 84 ms QT/QTcB 321/439 ms P-R-T axes 51 71 6 Sinus tachy Normal axis No STEMI  RADIOLOGY   No results found.   PROCEDURES:  Critical Care performed: No  Procedures   MEDICATIONS ORDERED IN ED: Medications - No data to display   IMPRESSION / MDM / ASSESSMENT AND PLAN / ED COURSE  I reviewed the triage vital signs  and the nursing notes.                              Differential diagnosis includes, but is not limited to, snake bite, abrasion, contact dermatitis  The patient is on the cardiac monitor to evaluate for evidence of arrhythmia and/or significant heart rate changes.  Patient's diagnosis is consistent with contact with non-venomous snake without evidence of bite injury.   ----------------------------------------- 5:55 PM on 02/16/2022 ----------------------------------------- Interim check on patient to give update: patient eloped. IV discarded in trash.      FINAL CLINICAL IMPRESSION(S) / ED DIAGNOSES   Final diagnoses:  Other contact with nonvenomous snake, initial encounter     Rx / DC Orders   ED Discharge Orders     None        Note:  This document was prepared using Dragon voice recognition software and may include unintentional dictation errors.    Lissa Hoard, PA-C 02/16/22 1857    Lissa Hoard, PA-C 02/16/22 1857    Phineas Semen, MD 02/16/22 1919

## 2022-02-16 NOTE — ED Triage Notes (Addendum)
Pt has a possible snake bite to left lower posterior leg.  Happened at approx 1500 today.  Pt has pain.  Pt alert .  Pt has picture of snake on phone.
